# Patient Record
Sex: Male | Born: 1990
Health system: Southern US, Community
[De-identification: ages and names within clinical notes are randomized; demographics above are authoritative.]

## PROBLEM LIST (undated history)

## (undated) DIAGNOSIS — J45909 Unspecified asthma, uncomplicated: Secondary | ICD-10-CM

## (undated) DIAGNOSIS — J309 Allergic rhinitis, unspecified: Secondary | ICD-10-CM

## (undated) DIAGNOSIS — F909 Attention-deficit hyperactivity disorder, unspecified type: Secondary | ICD-10-CM

## (undated) HISTORY — PX: APPENDECTOMY: SHX54

## (undated) HISTORY — DX: Attention-deficit hyperactivity disorder, unspecified type: F90.9

## (undated) HISTORY — DX: Allergic rhinitis, unspecified: J30.9

## (undated) HISTORY — DX: Unspecified asthma, uncomplicated: J45.909

---
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
## 2017-07-21 DIAGNOSIS — L245 Irritant contact dermatitis due to other chemical products: Secondary | ICD-10-CM | POA: Insufficient documentation

## 2017-07-21 DIAGNOSIS — F909 Attention-deficit hyperactivity disorder, unspecified type: Secondary | ICD-10-CM | POA: Insufficient documentation

## 2020-04-17 ENCOUNTER — Other Ambulatory Visit (HOSPITAL_COMMUNITY): Payer: Self-pay | Admitting: Internal Medicine

## 2020-04-17 DIAGNOSIS — G4452 New daily persistent headache (NDPH): Secondary | ICD-10-CM

## 2020-04-17 DIAGNOSIS — R200 Anesthesia of skin: Secondary | ICD-10-CM

## 2020-04-19 ENCOUNTER — Ambulatory Visit (HOSPITAL_COMMUNITY)
Admission: RE | Admit: 2020-04-19 | Discharge: 2020-04-19 | Disposition: A | Discharge status: Home / Self Care | Payer: Commercial Managed Care - PPO | Source: Ambulatory Visit | Attending: Internal Medicine | Admitting: Internal Medicine

## 2020-04-19 DIAGNOSIS — G4452 New daily persistent headache (NDPH): Secondary | ICD-10-CM | POA: Diagnosis present

## 2020-04-19 DIAGNOSIS — R200 Anesthesia of skin: Secondary | ICD-10-CM | POA: Insufficient documentation

## 2020-04-19 MED ORDER — GADOBUTROL 1 MMOL/ML IV SOLN
7.5000 mL | Freq: Once | INTRAVENOUS | Status: AC | PRN
  Administered 2020-04-19: 7.5 mL via INTRAVENOUS

## 2021-05-27 IMAGING — MR MR HEAD WO/W CM
14 of 20 series · 33 of 48 positions shown · IV contrast (gadavist)
Comparison: None.

CLINICAL DATA: New daily persistent headache with right facial
numbness

EXAM:
MRI HEAD WITHOUT AND WITH CONTRAST
MRV HEAD WITHOUT CONTRAST
TECHNIQUE: Multiplanar, multiecho pulse sequences of the brain and surrounding
structures were obtained without and with intravenous contrast.
Angiographic images of the intracranial venous structures were
obtained using MRV technique without intravenous contrast.
CONTRAST:  7.5mL GADAVIST GADOBUTROL 1 MMOL/ML IV SOLN

[Series 5: DWI · axial · 3.0mm · 0.88mm/px · z∈[-94,+52]mm · 5 of 100 slices shown (1 of 4)]
[im 1/100]
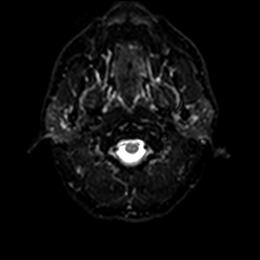
[im 25/100]
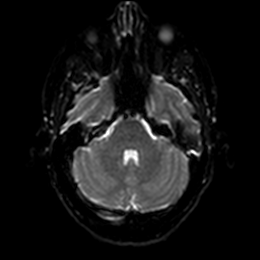
[im 50/100]
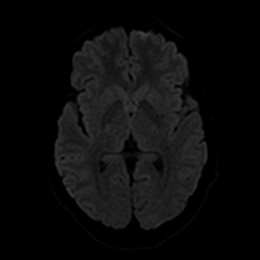
[im 75/100]
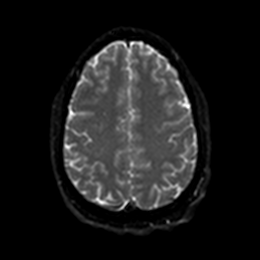
[im 100/100]
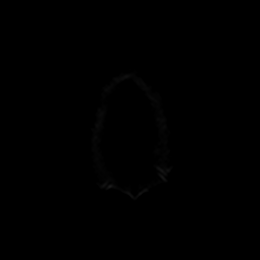

[Series 6: DWI · axial · 3.0mm · 0.88mm/px · z∈[-94,+52]mm · 3 of 49 slices shown (2 of 4)]
[im 1/49]
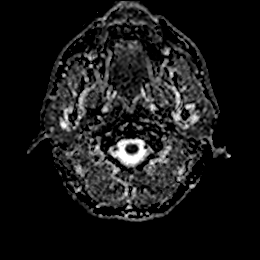
[im 25/49]
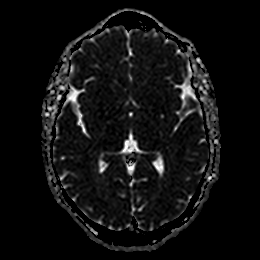
[im 49/49]
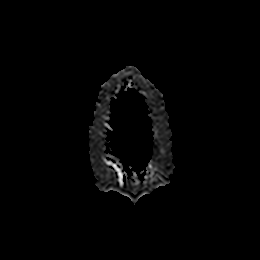

[Series 7: DWI · coronal · 4.0mm · 0.88mm/px · 4 of 76 slices shown (3 of 4)]
[im 1/76]
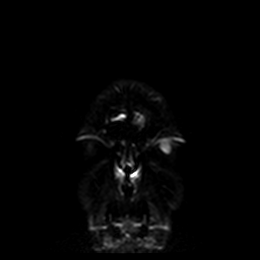
[im 26/76]
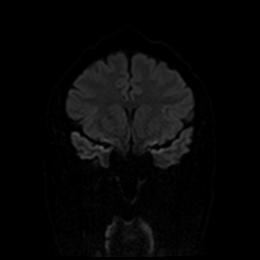
[im 51/76]
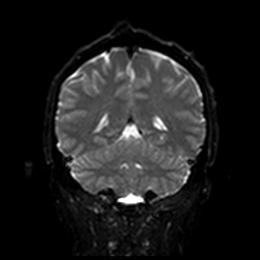
[im 76/76]
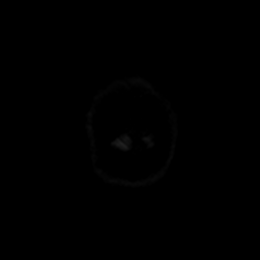

[Series 8: DWI · coronal · 4.0mm · 0.88mm/px · 2 of 38 slices shown (4 of 4)]
[im 1/38]
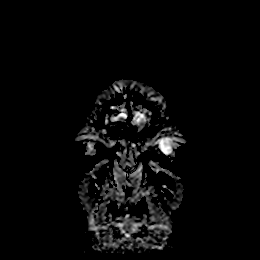
[im 38/38]
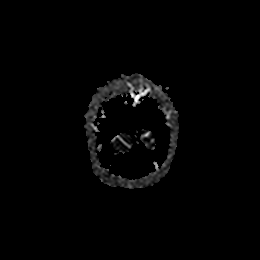

[Series 9: T1 · sagittal · 5.0mm · 0.75mm/px · 1 of 23 slices shown]
[im 1/23]
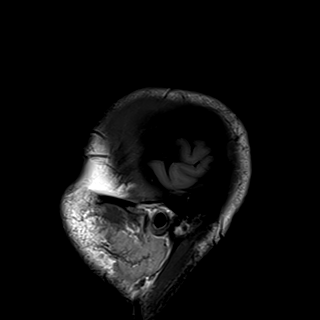

[Series 10: T2 · axial · 5.0mm · 0.72mm/px · 1 of 26 slices shown]
[im 1/26]
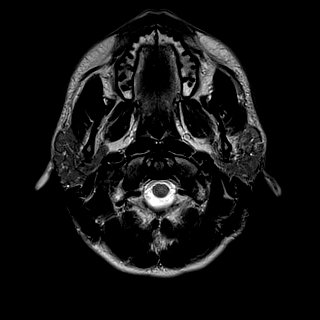

[Series 11: FLAIR · axial · 5.0mm · 0.45mm/px · 1 of 26 slices shown]
[im 1/26]
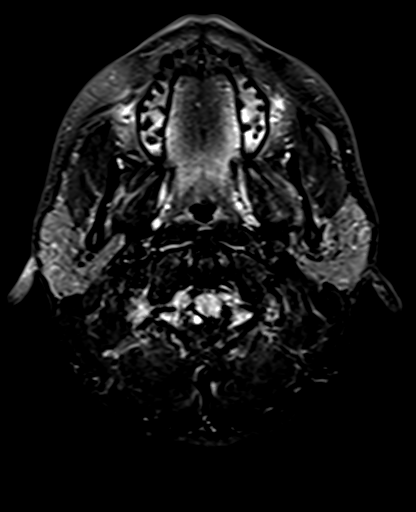

[Series 12: mag_images · axial · 3.0mm · 0.90mm/px · z∈[-102,+50]mm · 3 of 52 slices shown]
[im 1/52]
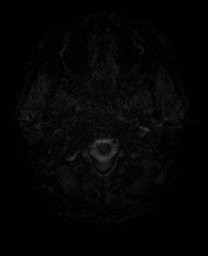
[im 26/52]
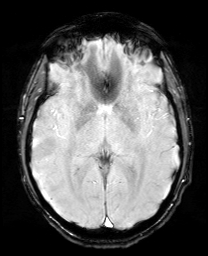
[im 52/52]
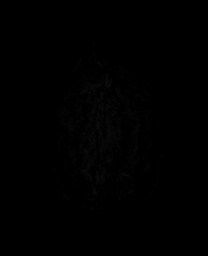

[Series 13: pha_images · axial · 3.0mm · 0.90mm/px · z∈[-102,+50]mm · 3 of 52 slices shown]
[im 1/52]
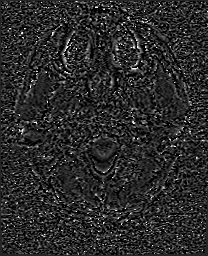
[im 26/52]
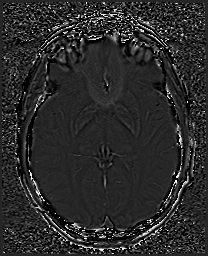
[im 52/52]
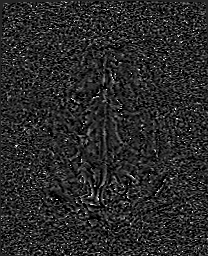

[Series 14: swi_images · axial · 3.0mm · 0.90mm/px · z∈[-102,+50]mm · 3 of 52 slices shown]
[im 1/52]
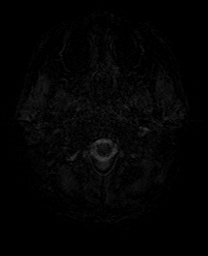
[im 26/52]
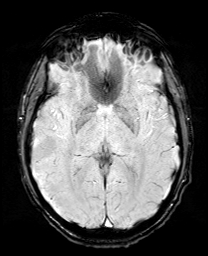
[im 52/52]
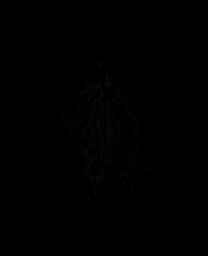

[Series 15: mip_images(sw) · axial · 24.0mm · 0.90mm/px · z∈[-91,+40]mm · 2 of 45 slices shown]
[im 1/45]
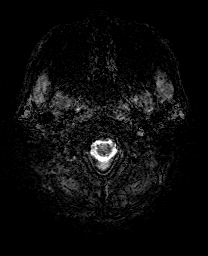
[im 45/45]
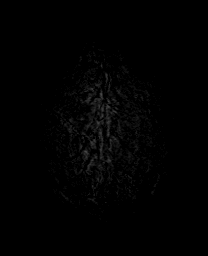

[Series 17: T2 post-contrast · coronal · 5.0mm · 0.72mm/px · 2 of 33 slices shown]
[im 1/33]
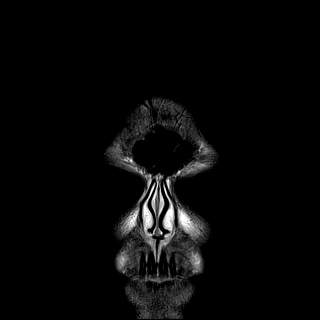
[im 33/33]
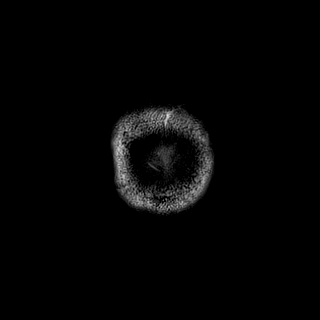

[Series 19: T1 post-contrast · coronal · 5.0mm · 0.34mm/px · 2 of 33 slices shown (1 of 2)]
[im 1/33]
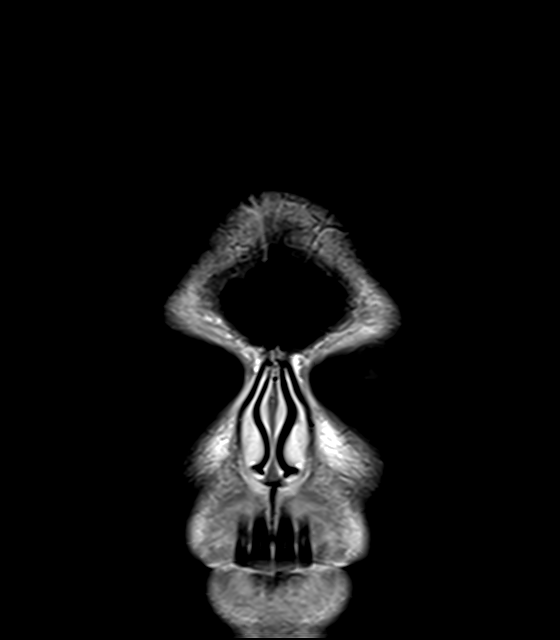
[im 33/33]
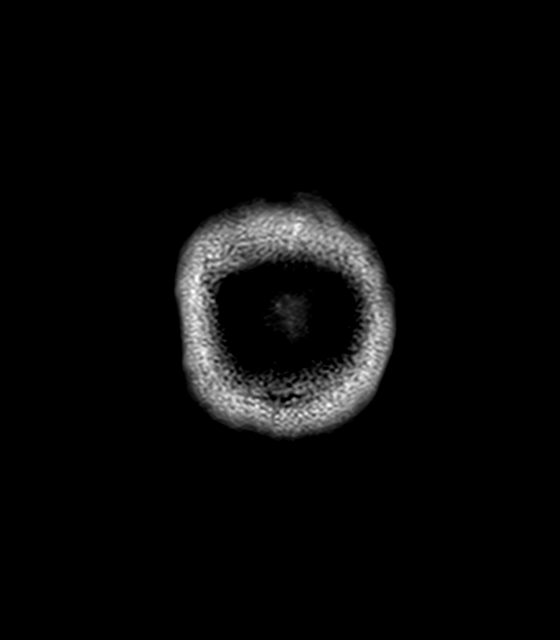

[Series 20: T1 post-contrast · sagittal · 5.0mm · 0.72mm/px · 1 of 23 slices shown (2 of 2)]
[im 1/23]
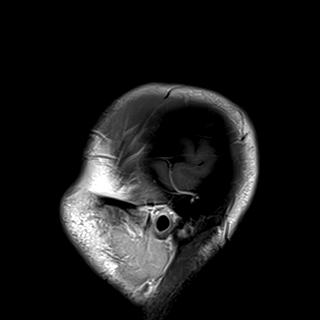

[33 of 48 positions shown; findings below may reference images not displayed]

FINDINGS: Brain MRI:

Brain: No infarct, hemorrhage, hydrocephalus, or white matter
disease. No masslike finding or abnormal intracranial enhancement.
Dilated perivascular space below the left putamen.

Vascular: Normal flow voids and vascular enhancements.

Skeleton: Normal marrow signal.

Orbits and sinuses: Small proteinaceous retention cysts in the
bilateral maxillary sinus. No mucosal thickening or fluid level for
active sinusitis. No fluid level or orbital inflammation.

MRV:

Relatively symmetric transverse and sigmoid dural sinuses. No dural
sinus or paired deep vein thrombosis. No stenosis or diverticulum.
IMPRESSION: Normal brain MRI and MRV.

## 2021-12-16 ENCOUNTER — Ambulatory Visit (HOSPITAL_BASED_OUTPATIENT_CLINIC_OR_DEPARTMENT_OTHER): Payer: Commercial Managed Care - PPO | Admitting: Family Medicine

## 2022-01-20 DIAGNOSIS — Z708 Other sex counseling: Secondary | ICD-10-CM | POA: Diagnosis not present

## 2022-01-20 DIAGNOSIS — Z202 Contact with and (suspected) exposure to infections with a predominantly sexual mode of transmission: Secondary | ICD-10-CM | POA: Diagnosis not present

## 2022-01-20 LAB — HM HEPATITIS C SCREENING LAB: HM Hepatitis Screen: NEGATIVE

## 2022-01-20 LAB — HM HIV SCREENING LAB: HM HIV Screening: NEGATIVE

## 2022-03-11 ENCOUNTER — Ambulatory Visit: Payer: Commercial Managed Care - PPO | Admitting: Internal Medicine

## 2022-03-25 ENCOUNTER — Telehealth: Payer: Self-pay | Admitting: Internal Medicine

## 2022-03-25 NOTE — Telephone Encounter (Signed)
pt would like to become a new pt, paz is listed on pt's insurance card

## 2022-03-28 ENCOUNTER — Encounter: Payer: Self-pay | Admitting: Internal Medicine

## 2022-03-28 NOTE — Telephone Encounter (Signed)
Please advise 

## 2022-03-28 NOTE — Telephone Encounter (Signed)
Is ok, thx  

## 2022-03-31 DIAGNOSIS — Z7251 High risk heterosexual behavior: Secondary | ICD-10-CM | POA: Insufficient documentation

## 2022-04-13 ENCOUNTER — Ambulatory Visit: Payer: BC Managed Care – PPO | Admitting: Internal Medicine

## 2022-05-04 ENCOUNTER — Ambulatory Visit: Payer: BC Managed Care – PPO | Admitting: Internal Medicine

## 2022-05-04 ENCOUNTER — Encounter: Payer: Self-pay | Admitting: Internal Medicine

## 2022-05-04 VITALS — BP 98/68 | HR 70 | Temp 98.3°F | Resp 18 | Ht 68.0 in | Wt 193.6 lb

## 2022-05-04 DIAGNOSIS — Z113 Encounter for screening for infections with a predominantly sexual mode of transmission: Secondary | ICD-10-CM | POA: Diagnosis not present

## 2022-05-04 DIAGNOSIS — R109 Unspecified abdominal pain: Secondary | ICD-10-CM | POA: Diagnosis not present

## 2022-05-04 DIAGNOSIS — Z7251 High risk heterosexual behavior: Secondary | ICD-10-CM

## 2022-05-04 DIAGNOSIS — N342 Other urethritis: Secondary | ICD-10-CM | POA: Diagnosis not present

## 2022-05-04 LAB — CBC
HCT: 44.9 % (ref 39.0–52.0)
Hemoglobin: 14.8 g/dL (ref 13.0–17.0)
MCHC: 32.9 g/dL (ref 30.0–36.0)
MCV: 84.6 fl (ref 78.0–100.0)
Platelets: 195 10*3/uL (ref 150.0–400.0)
RBC: 5.3 Mil/uL (ref 4.22–5.81)
RDW: 14 % (ref 11.5–15.5)
WBC: 5.8 10*3/uL (ref 4.0–10.5)

## 2022-05-04 LAB — COMPREHENSIVE METABOLIC PANEL
ALT: 38 U/L (ref 0–53)
AST: 41 U/L — ABNORMAL HIGH (ref 0–37)
Albumin: 4.7 g/dL (ref 3.5–5.2)
Alkaline Phosphatase: 72 U/L (ref 39–117)
BUN: 28 mg/dL — ABNORMAL HIGH (ref 6–23)
CO2: 28 mEq/L (ref 19–32)
Calcium: 9.4 mg/dL (ref 8.4–10.5)
Chloride: 100 mEq/L (ref 96–112)
Creatinine, Ser: 1.22 mg/dL (ref 0.40–1.50)
GFR: 79.57 mL/min (ref >60.00)
Glucose, Bld: 87 mg/dL (ref 70–99)
Potassium: 4.1 mEq/L (ref 3.5–5.1)
Sodium: 137 mEq/L (ref 135–145)
Total Bilirubin: 0.5 mg/dL (ref 0.2–1.2)
Total Protein: 7.5 g/dL (ref 6.0–8.3)

## 2022-05-04 LAB — TSH: TSH: 0.7 u[IU]/mL (ref 0.35–5.50)

## 2022-05-04 NOTE — Patient Instructions (Signed)
    GO TO THE LAB : Get the blood work     GO TO THE FRONT DESK, PLEASE SCHEDULE YOUR APPOINTMENTS Come back for a physical exam in 6 months     Safe Sex Practicing safe sex means taking steps before and during sex to reduce your risk of: Getting an STI (sexually transmitted infection). Giving your partner an STI. Unwanted or unplanned pregnancy. How to practice safe sex Ways you can practice safe sex  Limit your sexual partners to only one partner who is having sex with only you. Avoid using alcohol and drugs before having sex. Alcohol and drugs can affect your judgment. Before having sex with a new partner: Talk to your partner about past partners, past STIs, and drug use. Get screened for STIs and discuss the results with your partner. Ask your partner to get screened too. Check your body regularly for sores, blisters, rashes, or unusual discharge. If you notice any of these problems, visit your health care provider. Avoid sexual contact if you have symptoms of an infection or you are being treated for an STI. While having sex, use a condom. Make sure to: Use a condom every time you have vaginal, oral, or anal sex. Both females and males should wear condoms during oral sex. Keep condoms in place from the beginning to the end of sexual activity. Use a latex condom, if possible. Latex condoms offer the best protection. Use only water-based lubricants with a condom. Using petroleum-based lubricants or oils will weaken the condom and increase the chance that it will break. Ways your health care provider can help you practice safe sex  See your health care provider for regular screenings, exams, and tests for STIs. Talk with your health care provider about what kind of birth control (contraception) is best for you. Get vaccinated against hepatitis B and human papillomavirus (HPV). If you are at risk of being infected with HIV (human immunodeficiency virus), talk with your health  care provider about taking a prescription medicine to prevent HIV infection. You are at risk for HIV if you: Are a man who has sex with other men. Are sexually active with more than one partner. Take drugs by injection. Have a sex partner who has HIV. Have unprotected sex. Have sex with someone who has sex with both men and women. Have had an STI. Follow these instructions at home: Take over-the-counter and prescription medicines only as told by your health care provider. Keep all follow-up visits. This is important. Where to find more information Centers for Disease Control and Prevention: FootballExhibition.com.br Planned Parenthood: www.plannedparenthood.org Office on [employer] Health: http://hoffman.com/ Summary Practicing safe sex means taking steps before and during sex to reduce your risk getting an STI, giving your partner an STI, and having an unwanted or unplanned pregnancy. Before having sex with a new partner, talk to your partner about past partners, past STIs, and drug use. Use a condom every time you have vaginal, oral, or anal sex. Both females and males should wear condoms during oral sex. Check your body regularly for sores, blisters, rashes, or unusual discharge. If you notice any of these problems, visit your health care provider. See your health care provider for regular screenings, exams, and tests for STIs. This information is not intended to replace advice given to you by your health care provider. Make sure you discuss any questions you have with your health care provider. Document Revised: 03/30/2020 Document Reviewed: 03/30/2020 Elsevier Patient Education  2023 [employer].

## 2022-05-04 NOTE — Progress Notes (Unsigned)
   Subjective:    Patient ID: Samuel Jackson, male    DOB: 03-14-1991, 31 y.o.   MRN: [id number]  DOS:  05/04/2022 Type of visit - description: New patient  New patient, several concerns.  February 2023 he had a occasional sexual encounter, part of the encounter was with no condoms. Patient is "terrified" about having an STD. He denies fever or chills. No rashes. No penile discharge or hematuria but from time to times has dysuria No penile lesions. No history of previous STDs.  Also, from time to time has left-sided, ill-defined abdominal pain.  Pain is random.  No fever chills.  No weight loss.  No nausea or vomiting.  No blood in the stools.  Also concerned about his thyroid since his mother has thyroid disease  Review of Systems See above   Past Medical History:  Diagnosis Date   Adult ADHD    Allergic rhinitis    Asthma     *** The histories are not reviewed yet. Please review them in the "History" navigator section and refresh this SmartLink.  No current outpatient medications     Objective:   Physical Exam BP 98/68 (BP Location: Right Arm, Patient Position: Sitting, Cuff Size: Large)   Pulse 70   Temp 98.3 F (36.8 C) (Oral)   Resp 18   Ht 5\' 8"  (1.727 m)   Wt 193 lb 9.6 oz (87.8 kg)   SpO2 98%   BMI 29.44 kg/m  General:   Well developed, NAD, BMI noted.  HEENT:  Normocephalic . Face symmetric, atraumatic Neck: No thyromegaly, no LAD Lungs:  CTA B Normal respiratory effort, no intercostal retractions, no accessory muscle use. Heart: RRR,  no murmur.  Abdomen:  Not distended, soft, non-tender. No rebound or rigidity.   Skin: Not pale. Not jaundice GU: Uncircumcised. Scrotal contents normal Penis: No lesions or ulcers.  Question of a small discharge Lower extremities: no pretibial edema bilaterally  Neurologic:  alert & oriented X3.  Speech normal, gait appropriate for age and unassisted Psych--  Cognition and judgment appear intact.  Cooperative  with normal attention span and concentration.  Behavior appropriate. No anxious or depressed appearing.     Assessment    Assessment (new patient 04/2022) H/o ADD Allergies Asthma as a child   PLAN  Dysuria, at risk of STD: Patient has a fine and safe sex encounter few months ago, occasional dysuria, very concerned about STD. Extensive patient education regarding safe sex, always wear a condom including during oral sex. He verbalized understanding Sent swab for gonorrhea and chlamydia, HIV, RPR and hep C. Family history of thyroid disease: Exam is normal, check TSH Left-sided abdominal pain: Mild, benign exam, no red flags.  For completeness we will check a CMP and CBC. RTC 6 months CPE

## 2022-05-05 ENCOUNTER — Encounter: Payer: Self-pay | Admitting: Internal Medicine

## 2022-05-05 DIAGNOSIS — Z09 Encounter for follow-up examination after completed treatment for conditions other than malignant neoplasm: Secondary | ICD-10-CM | POA: Insufficient documentation

## 2022-05-05 LAB — C. TRACHOMATIS/N. GONORRHOEAE RNA
C. trachomatis RNA, TMA: NOT DETECTED
N. gonorrhoeae RNA, TMA: NOT DETECTED

## 2022-05-05 LAB — HEPATITIS C ANTIBODY: Hepatitis C Ab: NONREACTIVE

## 2022-05-05 LAB — HIV ANTIBODY (ROUTINE TESTING W REFLEX): HIV 1&2 Ab, 4th Generation: NONREACTIVE

## 2022-05-05 LAB — RPR: RPR Ser Ql: NONREACTIVE

## 2022-05-05 NOTE — Assessment & Plan Note (Signed)
Dysuria, at risk of STD: Patient had a unsafe sex encounter few months ago, occasional dysuria, very concerned about STD. Extensive patient education regarding safe sex, always wear a condom including during oral sex. He verbalized understanding Sent swab for gonorrhea and chlamydia, HIV, RPR and hep C. Family history of thyroid disease: Exam is normal, check TSH Left-sided abdominal pain: Mild, benign exam, no red flags.  For completeness we will check a CMP and CBC. RTC 6 months CPE

## 2022-11-16 ENCOUNTER — Encounter: Payer: Self-pay | Admitting: Internal Medicine

## 2022-11-16 ENCOUNTER — Ambulatory Visit (INDEPENDENT_AMBULATORY_CARE_PROVIDER_SITE_OTHER): Payer: 59 | Admitting: Internal Medicine

## 2022-11-16 VITALS — BP 116/68 | HR 67 | Temp 98.4°F | Resp 16 | Ht 68.0 in | Wt 205.4 lb

## 2022-11-16 DIAGNOSIS — Z7251 High risk heterosexual behavior: Secondary | ICD-10-CM

## 2022-11-16 DIAGNOSIS — Z0000 Encounter for general adult medical examination without abnormal findings: Secondary | ICD-10-CM

## 2022-11-16 DIAGNOSIS — R7989 Other specified abnormal findings of blood chemistry: Secondary | ICD-10-CM | POA: Diagnosis not present

## 2022-11-16 DIAGNOSIS — Z113 Encounter for screening for infections with a predominantly sexual mode of transmission: Secondary | ICD-10-CM | POA: Diagnosis not present

## 2022-11-16 DIAGNOSIS — R399 Unspecified symptoms and signs involving the genitourinary system: Secondary | ICD-10-CM | POA: Diagnosis not present

## 2022-11-16 NOTE — Progress Notes (Unsigned)
Subjective:    Patient ID: Samuel Jackson, male    DOB: 09/25/1991, 32 y.o.   MRN: [id number]  DOS:  11/16/2022 Type of visit - description: CPX  Here for CPX. He is again concerned about STDs and requested screening Denies actual dysuria or gross hematuria. No penile discharge but has some feeling of not emptying his bladder completely. Also had an episode of asthma 4 weeks ago, went to urgent care, he picked up prednisone and albuterol but apparently has not used them.  Currently with no symptoms.  Review of Systems  Other than above, a 14 point review of systems is negative      Past Medical History:  Diagnosis Date   Adult ADHD    Allergic rhinitis    Asthma     Past Surgical History:  Procedure Laterality Date   APPENDECTOMY     Social History   Socioeconomic History   Marital status: Single    Spouse name: Not on file   Number of children: Not on file   Years of education: Not on file   Highest education level: Not on file  Occupational History   Occupation: remote work for cybersecurity    Comment: finishid bachelors degree  Tobacco Use   Smoking status: Never   Smokeless tobacco: Never  Substance and Sexual Activity   Alcohol use: Yes    Comment: socially   Drug use: Never   Sexual activity: Not on file  Other Topics Concern   Not on file  Social History Narrative   Lives w/ girl friend    Social Determinants of Radio broadcast assistant Strain: Not on file  Food Insecurity: Not on file  Transportation Needs: Not on file  Physical Activity: Not on file  Stress: Not on file  Social Connections: Not on file  Intimate Partner Violence: Not on file     Current Outpatient Medications  Medication Instructions   albuterol (VENTOLIN HFA) 108 (90 Base) MCG/ACT inhaler 2 puffs, Every 6 hours PRN       Objective:   Physical Exam BP 116/68   Pulse 67   Temp 98.4 F (36.9 C) (Oral)   Resp 16   Ht 5\' 8"  (1.727 m)   Wt 205 lb 6 oz (93.2 kg)    SpO2 98%   BMI 31.23 kg/m  General: Well developed, NAD, BMI noted Neck: No  thyromegaly  HEENT:  Normocephalic . Face symmetric, atraumatic Lungs:  CTA B Normal respiratory effort, no intercostal retractions, no accessory muscle use. Heart: RRR,  no murmur.  Abdomen:  Not distended, soft, non-tender. No rebound or rigidity.   Lower extremities: no pretibial edema bilaterally GU: Uncircumcised. No penile discharge or lesions DRE: Normal sphincter tone.  Normal prostate.  No tender Skin: Exposed areas without rash. Not pale. Not jaundice Neurologic:  alert & oriented X3.  Speech normal, gait appropriate for age and unassisted Strength symmetric and appropriate for age.  Psych: Cognition and judgment appear intact.  Cooperative with normal attention span and concentration.  Behavior appropriate. No anxious or depressed appearing.     Assessment      Assessment (new patient 04/2022) H/o ADD Allergies Asthma as a child   PLAN Here for CPX STD screening, recent  LUTS. Again patient concerned about STDs and request screening although he has been sexually active only with his girlfriend.  He also has ill-defined LUTS.  DRE not supportive for prostatitis. Plan: Continue safe sex, see labs. Increased LFTs: Modest  increase in LFTs few months ago, reports that he drank alcohol multiple times during December but nothing in the last 10 days.  Recheck LFTs RTC 6 months depending on results.

## 2022-11-16 NOTE — Patient Instructions (Addendum)
Please send [place] a copy (or drop off) of your childhood immunizations.    Vaccines I recommend: Covid shot Flu shot       GO TO THE LAB : Get the blood work, provide a urine sample   GO TO THE FRONT DESK, [place] back for a checkup in 6 months

## 2022-11-17 ENCOUNTER — Encounter: Payer: Self-pay | Admitting: Internal Medicine

## 2022-11-17 DIAGNOSIS — Z0000 Encounter for general adult medical examination without abnormal findings: Secondary | ICD-10-CM | POA: Insufficient documentation

## 2022-11-17 LAB — LIPID PANEL
Cholesterol: 304 mg/dL — ABNORMAL HIGH (ref 0–200)
HDL: 59.7 mg/dL (ref >39.00)
NonHDL: 244.61
Total CHOL/HDL Ratio: 5
Triglycerides: 221 mg/dL — ABNORMAL HIGH (ref 0.0–149.0)
VLDL: 44.2 mg/dL — ABNORMAL HIGH (ref 0.0–40.0)

## 2022-11-17 LAB — URINALYSIS, ROUTINE W REFLEX MICROSCOPIC
Bilirubin Urine: NEGATIVE
Hgb urine dipstick: NEGATIVE
Leukocytes,Ua: NEGATIVE
Nitrite: NEGATIVE
Specific Gravity, Urine: 1.025 (ref 1.000–1.030)
Total Protein, Urine: NEGATIVE
Urine Glucose: NEGATIVE
Urobilinogen, UA: 0.2 (ref 0.0–1.0)
pH: 6 (ref 5.0–8.0)

## 2022-11-17 LAB — AST: AST: 61 U/L — ABNORMAL HIGH (ref 0–37)

## 2022-11-17 LAB — LDL CHOLESTEROL, DIRECT: Direct LDL: 202 mg/dL

## 2022-11-17 LAB — ALT: ALT: 31 U/L (ref 0–53)

## 2022-11-17 LAB — PSA: PSA: 0.82 ng/mL (ref 0.10–4.00)

## 2022-11-17 NOTE — Assessment & Plan Note (Signed)
Here for CPX STD screening, recent  LUTS. Again patient concerned about STDs and request screening although he has been sexually active only with his girlfriend.  He also has ill-defined LUTS.  DRE not supportive for prostatitis. Plan: Continue safe sex, see labs. Increased LFTs: Modest increase in LFTs few months ago, reports that he drank alcohol multiple times during December but nothing in the last 10 days.  Recheck LFTs RTC 6 months depending on results.

## 2022-11-17 NOTE — Assessment & Plan Note (Signed)
Tdap 2019 Recommend to get vaccine records. Offered a flu shot.  Recommend the COVID-vaccine. Labs  AST ALT FLP HIV RPR  UA urine culture PSA Patient education: Diet, exercise safe sex, sex driving and self testicular exam.

## 2022-11-18 LAB — HIV ANTIBODY (ROUTINE TESTING W REFLEX): HIV 1&2 Ab, 4th Generation: NONREACTIVE

## 2022-11-18 LAB — RPR: RPR Ser Ql: NONREACTIVE

## 2023-06-13 ENCOUNTER — Ambulatory Visit: Payer: 59 | Admitting: Internal Medicine

## 2023-09-13 ENCOUNTER — Encounter: Payer: Self-pay | Admitting: Internal Medicine
